# Patient Record
Sex: Female | Born: 1985 | Race: Black or African American | Hispanic: No | Marital: Single | Smoking: Never smoker
Health system: Southern US, Community
[De-identification: ages and names within clinical notes are randomized; demographics above are authoritative.]

## PROBLEM LIST (undated history)

## (undated) ENCOUNTER — Inpatient Hospital Stay (HOSPITAL_COMMUNITY): Payer: Self-pay

## (undated) DIAGNOSIS — Z8619 Personal history of other infectious and parasitic diseases: Secondary | ICD-10-CM

## (undated) DIAGNOSIS — Z789 Other specified health status: Secondary | ICD-10-CM

## (undated) HISTORY — DX: Personal history of other infectious and parasitic diseases: Z86.19

## (undated) HISTORY — PX: NO PAST SURGERIES: SHX2092

---

## 2009-09-25 ENCOUNTER — Emergency Department: Payer: Self-pay | Admitting: Internal Medicine

## 2014-02-14 ENCOUNTER — Inpatient Hospital Stay (HOSPITAL_COMMUNITY)
Admission: AD | Admit: 2014-02-14 | Discharge: 2014-02-14 | Disposition: A | Discharge status: Home / Self Care | Payer: 59 | Source: Ambulatory Visit | Attending: Obstetrics & Gynecology | Admitting: Obstetrics & Gynecology

## 2014-02-14 ENCOUNTER — Encounter (HOSPITAL_COMMUNITY): Payer: Self-pay | Admitting: *Deleted

## 2014-02-14 ENCOUNTER — Inpatient Hospital Stay (HOSPITAL_COMMUNITY): Payer: 59

## 2014-02-14 DIAGNOSIS — O26891 Other specified pregnancy related conditions, first trimester: Secondary | ICD-10-CM | POA: Insufficient documentation

## 2014-02-14 DIAGNOSIS — R109 Unspecified abdominal pain: Secondary | ICD-10-CM | POA: Diagnosis not present

## 2014-02-14 DIAGNOSIS — Z3201 Encounter for pregnancy test, result positive: Secondary | ICD-10-CM

## 2014-02-14 DIAGNOSIS — R102 Pelvic and perineal pain: Secondary | ICD-10-CM

## 2014-02-14 DIAGNOSIS — Z3A01 Less than 8 weeks gestation of pregnancy: Secondary | ICD-10-CM | POA: Insufficient documentation

## 2014-02-14 HISTORY — DX: Other specified health status: Z78.9

## 2014-02-14 LAB — URINALYSIS, ROUTINE W REFLEX MICROSCOPIC
Bilirubin Urine: NEGATIVE
Glucose, UA: NEGATIVE mg/dL
Hgb urine dipstick: NEGATIVE
Ketones, ur: NEGATIVE mg/dL
Leukocytes, UA: NEGATIVE
Nitrite: NEGATIVE
Protein, ur: NEGATIVE mg/dL
SPECIFIC GRAVITY, URINE: 1.01 (ref 1.005–1.030)
Urobilinogen, UA: 0.2 mg/dL (ref 0.0–1.0)
pH: 6.5 (ref 5.0–8.0)

## 2014-02-14 LAB — WET PREP, GENITAL
Clue Cells Wet Prep HPF POC: NONE SEEN
Trich, Wet Prep: NONE SEEN
Yeast Wet Prep HPF POC: NONE SEEN

## 2014-02-14 LAB — HCG, QUANTITATIVE, PREGNANCY: hCG, Beta Chain, Quant, S: 14999 m[IU]/mL — ABNORMAL HIGH (ref <5)

## 2014-02-14 LAB — POCT PREGNANCY, URINE: Preg Test, Ur: POSITIVE — AB

## 2014-02-14 NOTE — Discharge Instructions (Signed)

## 2014-02-14 NOTE — MAU Provider Note (Signed)
History     CSN: [id number]  Arrival date and time: 02/14/14 1246 Provider notified of pt arrival @1332  First Provider Initiated Contact with Patient 02/14/14 1336      Chief Complaint  Patient presents with  . Abdominal Pain   HPI Comments: G1 @[redacted]w[redacted]d  by LMP c/o lower abd cramping mostly on RLQ intermittently over last few weeks and worsening today. No VB. No constipation, diarrhea, nausea, or vomiting. No dysuria, polyuria, hematuria, or back pain. No new sexual partner. No hx STDs. [facility] but has [facility] scheduled.    OB History    Gravida Para Term Preterm AB TAB SAB Ectopic Multiple Living   1               Past Medical History  Diagnosis Date  . Medical history non-contributory     Past Surgical History  Procedure Laterality Date  . No past surgeries      History reviewed. No pertinent family history.  History  Substance Use Topics  . Smoking status: Never Smoker   . Smokeless tobacco: Not on file  . Alcohol Use: No    Allergies: No Known Allergies  No prescriptions prior to admission    Review of Systems  Constitutional: Negative.   HENT: Negative.   Eyes: Negative.   Respiratory: Negative.   Cardiovascular: Negative.   Skin: Negative.   Neurological: Negative.   Endo/Heme/Allergies: Negative.   Psychiatric/Behavioral: Negative.    Physical Exam   Blood pressure 125/71, pulse 68, temperature 98.2 F (36.8 C), temperature source Oral, resp. rate 16, height 5\' 5"  (1.651 m), weight 63.504 kg (140 lb), last menstrual period 12/28/2013.  Physical Exam  Constitutional: She is oriented to person, place, and time. She appears well-developed and well-nourished.  HENT:  Head: Normocephalic and atraumatic.  Eyes: Pupils are equal, round, and reactive to light.  Neck: Normal range of motion. Neck supple.  Cardiovascular: Normal rate.   Respiratory: Effort normal.  GI: Soft. Bowel sounds are normal. She exhibits no distension and no mass.  There is no tenderness. There is no rebound and no guarding.  Genitourinary: Vagina normal and uterus normal.  Speculum: scant thin white discharge, nullip os, Gc/CMT and wet prep obtained. SVE closed long, uterine s=d  Musculoskeletal: Normal range of motion.  Neurological: She is alert and oriented to person, place, and time.  Skin: Skin is warm and dry.  Psychiatric: She has a normal mood and affect.    MAU Course  Procedures  MDM UA Results for Lloyd HugerDEBOSE, Suzannah (MRN [id number]) as of 02/14/2014 14:03  Ref. Range 02/14/2014 13:14  Color, Urine Latest Range: YELLOW  YELLOW  APPearance Latest Range: CLEAR  CLEAR  Specific Gravity, Urine Latest Range: 1.005-1.030  1.010  pH Latest Range: 5.0-8.0  6.5  Glucose Latest Range: NEGATIVE mg/dL NEGATIVE  Bilirubin Urine Latest Range: NEGATIVE  NEGATIVE  Ketones, ur Latest Range: NEGATIVE mg/dL NEGATIVE  Protein Latest Range: NEGATIVE mg/dL NEGATIVE  Urobilinogen, UA Latest Range: 0.0-1.0 mg/dL 0.2  Nitrite Latest Range: NEGATIVE  NEGATIVE  Leukocytes, UA Latest Range: NEGATIVE  NEGATIVE  Hgb urine dipstick Latest Range: NEGATIVE  NEGATIVE   Wet prep-neg  GC/CMT culture-pending  Sono: TECHNIQUE: Both transabdominal and transvaginal ultrasound examinations were performed for complete evaluation of the gestation as well as the maternal uterus, adnexal regions, and pelvic cul-de-sac. Transvaginal technique was performed to assess early pregnancy.  COMPARISON: None.  FINDINGS: Intrauterine gestational sac: Visualized/normal in shape.  Yolk sac: Present  Embryo: Not visualized  MSD: 11.8 mm 5 w 6 d  US EDC: 10/11/2014  Maternal uterus/adnexae: No subchorionic hemorrhage is noted. Corpus luteum cyst is noted within the left ovary. No other focal abnormality is noted.  IMPRESSION: Probable early intrauterine gestational sac, but no fetal pole, or cardiac activity yet visualized. Recommend follow-up  quantitative B-HCG levels and follow-up US in 14 days to confirm and assess viability. This recommendation follows SRU consensus guidelines: Diagnostic Criteria for Nonviable Pregnancy Early in the First Trimester. Malva Limes Engl J Med 2013; 161:0960-45; 369:1443-51.  Quant [id number] Assessment and Plan  [id number] IUGS-no FP  Pregnancy confirmation Cramping in first trimester  Discharge home Repeat sono in 1 week in office for fetal viability Continue PNV SAB precautions  Discussed A/P with Dr. Juliene PinaMody.  Aleynah Rocchio, N 02/14/2014, 1:57 PM

## 2014-02-14 NOTE — MAU Note (Signed)
Pt starting prenatal care 11/16.  Pos HPT & UPT in October.  Lower abd cramping & pelvic pain started today.  Denies bleeding or discharge.

## 2014-02-16 LAB — GC/CHLAMYDIA PROBE AMP
CT PROBE, AMP APTIMA: NEGATIVE
GC Probe RNA: NEGATIVE

## 2014-10-05 LAB — OB RESULTS CONSOLE RUBELLA ANTIBODY, IGM: Rubella: IMMUNE

## 2014-10-05 LAB — OB RESULTS CONSOLE HIV ANTIBODY (ROUTINE TESTING): HIV: NONREACTIVE

## 2014-10-05 LAB — OB RESULTS CONSOLE HEPATITIS B SURFACE ANTIGEN: Hepatitis B Surface Ag: NEGATIVE

## 2014-10-05 LAB — OB RESULTS CONSOLE ABO/RH: RH TYPE: POSITIVE

## 2014-10-05 LAB — OB RESULTS CONSOLE GC/CHLAMYDIA
CHLAMYDIA, DNA PROBE: NEGATIVE
Gonorrhea: NEGATIVE

## 2014-10-05 LAB — OB RESULTS CONSOLE ANTIBODY SCREEN: Antibody Screen: NEGATIVE

## 2014-10-05 LAB — OB RESULTS CONSOLE RPR: RPR: NONREACTIVE

## 2014-12-20 ENCOUNTER — Encounter (HOSPITAL_COMMUNITY): Payer: Self-pay | Admitting: *Deleted

## 2015-04-15 LAB — OB RESULTS CONSOLE GBS: GBS: POSITIVE

## 2015-05-10 ENCOUNTER — Encounter (HOSPITAL_COMMUNITY): Payer: Self-pay

## 2015-05-10 ENCOUNTER — Other Ambulatory Visit: Payer: Self-pay | Admitting: Obstetrics and Gynecology

## 2015-05-10 ENCOUNTER — Encounter (HOSPITAL_COMMUNITY): Payer: Self-pay | Admitting: *Deleted

## 2015-05-10 ENCOUNTER — Inpatient Hospital Stay (HOSPITAL_COMMUNITY)
Admission: AD | Admit: 2015-05-10 | Discharge: 2015-05-14 | DRG: 765 | Disposition: A | Discharge status: Home / Self Care | Payer: 59 | Source: Ambulatory Visit | Attending: Obstetrics and Gynecology | Admitting: Obstetrics and Gynecology

## 2015-05-10 ENCOUNTER — Telehealth (HOSPITAL_COMMUNITY): Payer: Self-pay | Admitting: *Deleted

## 2015-05-10 ENCOUNTER — Inpatient Hospital Stay (HOSPITAL_COMMUNITY): Payer: 59 | Admitting: Anesthesiology

## 2015-05-10 ENCOUNTER — Other Ambulatory Visit: Payer: Self-pay | Admitting: Obstetrics & Gynecology

## 2015-05-10 DIAGNOSIS — D62 Acute posthemorrhagic anemia: Secondary | ICD-10-CM | POA: Diagnosis not present

## 2015-05-10 DIAGNOSIS — Z833 Family history of diabetes mellitus: Secondary | ICD-10-CM

## 2015-05-10 DIAGNOSIS — Z3A40 40 weeks gestation of pregnancy: Secondary | ICD-10-CM | POA: Diagnosis not present

## 2015-05-10 DIAGNOSIS — O99824 Streptococcus B carrier state complicating childbirth: Secondary | ICD-10-CM | POA: Diagnosis present

## 2015-05-10 DIAGNOSIS — O9081 Anemia of the puerperium: Secondary | ICD-10-CM | POA: Diagnosis not present

## 2015-05-10 DIAGNOSIS — IMO0001 Reserved for inherently not codable concepts without codable children: Secondary | ICD-10-CM

## 2015-05-10 DIAGNOSIS — O480 Post-term pregnancy: Secondary | ICD-10-CM | POA: Diagnosis present

## 2015-05-10 DIAGNOSIS — N838 Other noninflammatory disorders of ovary, fallopian tube and broad ligament: Secondary | ICD-10-CM | POA: Diagnosis present

## 2015-05-10 DIAGNOSIS — O3483 Maternal care for other abnormalities of pelvic organs, third trimester: Secondary | ICD-10-CM | POA: Diagnosis present

## 2015-05-10 LAB — TYPE AND SCREEN
ABO/RH(D): O POS
Antibody Screen: NEGATIVE

## 2015-05-10 LAB — CBC
HCT: 33.2 % — ABNORMAL LOW (ref 36.0–46.0)
Hemoglobin: 11.1 g/dL — ABNORMAL LOW (ref 12.0–15.0)
MCH: 28 pg (ref 26.0–34.0)
MCHC: 33.4 g/dL (ref 30.0–36.0)
MCV: 83.6 fL (ref 78.0–100.0)
PLATELETS: 285 10*3/uL (ref 150–400)
RBC: 3.97 MIL/uL (ref 3.87–5.11)
RDW: 13.5 % (ref 11.5–15.5)
WBC: 11.5 10*3/uL — ABNORMAL HIGH (ref 4.0–10.5)

## 2015-05-10 LAB — ABO/RH: ABO/RH(D): O POS

## 2015-05-10 MED ORDER — CITRIC ACID-SODIUM CITRATE 334-500 MG/5ML PO SOLN
30.0000 mL | ORAL | Status: DC | PRN
  Administered 2015-05-11: 30 mL via ORAL
  Filled 2015-05-10: qty 15

## 2015-05-10 MED ORDER — FENTANYL 2.5 MCG/ML BUPIVACAINE 1/10 % EPIDURAL INFUSION (WH - ANES)
14.0000 mL/h | INTRAMUSCULAR | Status: DC | PRN
  Administered 2015-05-10: 14 mL/h via EPIDURAL
  Filled 2015-05-10: qty 125

## 2015-05-10 MED ORDER — PENICILLIN G POTASSIUM 5000000 UNITS IJ SOLR
5.0000 10*6.[IU] | Freq: Once | INTRAVENOUS | Status: AC
  Administered 2015-05-10: 5 10*6.[IU] via INTRAVENOUS
  Filled 2015-05-10: qty 5

## 2015-05-10 MED ORDER — LIDOCAINE HCL (PF) 1 % IJ SOLN
INTRAMUSCULAR | Status: DC | PRN
  Administered 2015-05-10: 2 mL
  Administered 2015-05-10: 5 mL via EPIDURAL
  Administered 2015-05-10: 3 mL via EPIDURAL

## 2015-05-10 MED ORDER — ONDANSETRON HCL 4 MG/2ML IJ SOLN: 4.0000 mg | Freq: Four times a day (QID) | INTRAMUSCULAR | Status: DC | PRN

## 2015-05-10 MED ORDER — DIPHENHYDRAMINE HCL 50 MG/ML IJ SOLN: 12.5000 mg | INTRAMUSCULAR | Status: DC | PRN

## 2015-05-10 MED ORDER — ACETAMINOPHEN 325 MG PO TABS: 650.0000 mg | ORAL_TABLET | ORAL | Status: DC | PRN

## 2015-05-10 MED ORDER — PHENYLEPHRINE 40 MCG/ML (10ML) SYRINGE FOR IV PUSH (FOR BLOOD PRESSURE SUPPORT)
80.0000 ug | PREFILLED_SYRINGE | INTRAVENOUS | Status: DC | PRN
  Filled 2015-05-10: qty 20

## 2015-05-10 MED ORDER — EPHEDRINE 5 MG/ML INJ: 10.0000 mg | INTRAVENOUS | Status: DC | PRN

## 2015-05-10 MED ORDER — OXYCODONE-ACETAMINOPHEN 5-325 MG PO TABS: 1.0000 | ORAL_TABLET | ORAL | Status: DC | PRN

## 2015-05-10 MED ORDER — OXYCODONE-ACETAMINOPHEN 5-325 MG PO TABS: 2.0000 | ORAL_TABLET | ORAL | Status: DC | PRN

## 2015-05-10 MED ORDER — TERBUTALINE SULFATE 1 MG/ML IJ SOLN: 0.2500 mg | Freq: Once | INTRAMUSCULAR | Status: DC | PRN

## 2015-05-10 MED ORDER — OXYTOCIN BOLUS FROM INFUSION: 500.0000 mL | INTRAVENOUS | Status: DC

## 2015-05-10 MED ORDER — FLEET ENEMA 7-19 GM/118ML RE ENEM: 1.0000 | ENEMA | RECTAL | Status: DC | PRN

## 2015-05-10 MED ORDER — LACTATED RINGERS IV SOLN
INTRAVENOUS | Status: DC
  Administered 2015-05-10 – 2015-05-11 (×3): via INTRAVENOUS

## 2015-05-10 MED ORDER — OXYTOCIN 10 UNIT/ML IJ SOLN
1.0000 m[IU]/min | INTRAVENOUS | Status: DC
  Administered 2015-05-10: 2 m[IU]/min via INTRAVENOUS

## 2015-05-10 MED ORDER — PENICILLIN G POTASSIUM 5000000 UNITS IJ SOLR
2.5000 10*6.[IU] | INTRAVENOUS | Status: DC
  Administered 2015-05-10: 2.5 10*6.[IU] via INTRAVENOUS
  Filled 2015-05-10 (×5): qty 2.5

## 2015-05-10 MED ORDER — LACTATED RINGERS IV SOLN
2.5000 [IU]/h | INTRAVENOUS | Status: DC
  Filled 2015-05-10: qty 4

## 2015-05-10 MED ORDER — LACTATED RINGERS IV SOLN: 500.0000 mL | INTRAVENOUS | Status: DC | PRN

## 2015-05-10 MED ORDER — LIDOCAINE HCL (PF) 1 % IJ SOLN: 30.0000 mL | INTRAMUSCULAR | Status: DC | PRN

## 2015-05-10 NOTE — MAU Note (Signed)
Notified provider that patient is here for SROM fern test positive. Contracting every 2-3 mins. Baby nonreactive HR baseline 125. 3cm on cervical exam. Provider said to admit patient.

## 2015-05-10 NOTE — Anesthesia Preprocedure Evaluation (Signed)
Anesthesia Evaluation  Patient identified by MRN, date of birth, ID band Patient awake    Reviewed: Allergy & Precautions, NPO status , Patient's Chart, lab work & pertinent test results  Airway Mallampati: I  TM Distance: >3 FB Neck ROM: Full    Dental  (+) Teeth Intact, Dental Advisory Given   Pulmonary neg pulmonary ROS,    Pulmonary exam normal breath sounds clear to auscultation       Cardiovascular Exercise Tolerance: Good negative cardio ROS Normal cardiovascular exam Rhythm:Regular Rate:Normal     Neuro/Psych negative neurological ROS  negative psych ROS   GI/Hepatic negative GI ROS, Neg liver ROS,   Endo/Other  negative endocrine ROS  Renal/GU negative Renal ROS     Musculoskeletal negative musculoskeletal ROS (+)   Abdominal   Peds  Hematology  (+) Blood dyscrasia, anemia , Plt 285k   Anesthesia Other Findings Day of surgery medications reviewed with the patient.  Reproductive/Obstetrics (+) Pregnancy                             Anesthesia Physical Anesthesia Plan  ASA: II  Anesthesia Plan: Epidural   Post-op Pain Management:    Induction:   Airway Management Planned:   Additional Equipment:   Intra-op Plan:   Post-operative Plan:   Informed Consent: I have reviewed the patients History and Physical, chart, labs and discussed the procedure including the risks, benefits and alternatives for the proposed anesthesia with the patient or authorized representative who has indicated his/her understanding and acceptance.   Dental advisory given  Plan Discussed with:   Anesthesia Plan Comments: (Patient identified. Risks/Benefits/Options discussed with patient including but not limited to bleeding, infection, nerve damage, paralysis, failed block, incomplete pain control, headache, blood pressure changes, nausea, vomiting, reactions to medication both or allergic, itching  and postpartum back pain. Confirmed with bedside nurse the patient's most recent platelet count. Confirmed with patient that they are not currently taking any anticoagulation, have any bleeding history or any family history of bleeding disorders. Patient expressed understanding and wished to proceed. All questions were answered. )        Anesthesia Quick Evaluation

## 2015-05-10 NOTE — H&P (Signed)
Vanessa Ritter is a 30 y.o. female presenting @ 72 1/[redacted] weeks gestation for admission due to SROm @ 5:30 pm clear fluid. (+) ctx. GBS cx (+). Denies h/a, visual changes or BP issues . Maternal Medical History:  Reason for admission: Rupture of membranes and contractions.   Contractions: Onset was 3-5 hours ago.    Fetal activity: Perceived fetal activity is normal.    Prenatal complications: no prenatal complications Prenatal Complications - Diabetes: none.    OB History    Gravida Para Term Preterm AB TAB SAB Ectopic Multiple Living   Past Medical History  Diagnosis Date  . Medical history non-contributory   . Hx of varicella    Past Surgical History  Procedure Laterality Date  . No past surgeries     Family History: family history includes Diabetes in her maternal grandfather. Social History:  reports that she has never smoked. She does not have any smokeless tobacco history on file. She reports that she does not drink alcohol or use illicit drugs.   Prenatal Transfer Tool  Maternal Diabetes: No Genetic Screening: Declined Maternal Ultrasounds/Referrals: Normal Fetal Ultrasounds or other Referrals:  None Maternal Substance Abuse:  No Significant Maternal Medications:  None Significant Maternal Lab Results:  Lab values include: Group B Strep positive Other Comments:  None  Review of Systems  All other systems reviewed and are negative.   Dilation: 3 Effacement (%): 90 Station: -2 Exam by:: V Rogers RN  Blood pressure 142/93, pulse 76, temperature 98.8 F (37.1 C), temperature source Oral, resp. rate 17, height  (1.651 m), weight 79.379 kg (175 lb), last menstrual period 07/26/2014, unknown if currently breastfeeding. Exam Physical Exam  Constitutional: She appears well-developed and well-nourished.  Eyes: EOM are normal.  Neck: Neck supple.  Cardiovascular: Normal rate.   Respiratory: Effort normal.  GI: Soft.  Musculoskeletal: She  exhibits edema.  Neurological: She displays abnormal reflex.  Skin: Skin is warm and dry.  Psychiatric: She has a normal mood and affect.   VE 3/90/+1  Prenatal labs: ABO, Rh: --/--/O POS (01/24 1854) Antibody: NEG (01/24 1854) Rubella: Immune (06/21 0000) RPR: Nonreactive (06/21 0000)  HBsAg: Negative (06/21 0000)  HIV: Non-reactive (06/21 0000)  GBS: Positive (12/30 0000)   Assessment/Plan: SROM Latent phase GBS cx (+) on PCN Postterm P) admit routine labs. Pitocin augmentation. IV PCN. Analgesic/epidural  prn  Vanessa Ritter A 05/10/2015, 8:33 PM

## 2015-05-10 NOTE — Anesthesia Procedure Notes (Signed)
Epidural Patient location during procedure: OB  Staffing Anesthesiologist: Bernal Luhman EDWARD Performed by: anesthesiologist   Preanesthetic Checklist Completed: patient identified, pre-op evaluation, timeout performed, IV checked, risks and benefits discussed and monitors and equipment checked  Epidural Patient position: sitting Prep: DuraPrep Patient monitoring: blood pressure and continuous pulse ox Approach: midline Location: L3-L4 Injection technique: LOR air  Needle:  Needle type: Tuohy  Needle gauge: 17 G Needle length: 9 cm Needle insertion depth: 5 cm Catheter size: 19 Gauge Catheter at skin depth: 10 cm Test dose: negative and Other (1% Lidocaine)  Additional Notes Patient identified.  Risk benefits discussed including failed block, incomplete pain control, headache, nerve damage, paralysis, blood pressure changes, nausea, vomiting, reactions to medication both toxic or allergic, and postpartum back pain.  Patient expressed understanding and wished to proceed.  All questions were answered.  Sterile technique used throughout procedure and epidural site dressed with sterile barrier dressing. No paresthesia or other complications noted. The patient did not experience any signs of intravascular injection such as tinnitus or metallic taste in mouth nor signs of intrathecal spread such as rapid motor block. Please see nursing notes for vital signs. Reason for block:procedure for pain   

## 2015-05-10 NOTE — MAU Note (Signed)
Patient presents with PROM at 1700 today and contractions. Patient denies vaginal bleeding. Fetus active.

## 2015-05-10 NOTE — Telephone Encounter (Signed)
Preadmission screen  

## 2015-05-11 ENCOUNTER — Encounter (HOSPITAL_COMMUNITY): Payer: Self-pay | Admitting: Certified Nurse Midwife

## 2015-05-11 ENCOUNTER — Encounter (HOSPITAL_COMMUNITY)
Admission: AD | Disposition: A | Discharge status: Home / Self Care | Payer: Self-pay | Source: Ambulatory Visit | Attending: Obstetrics and Gynecology

## 2015-05-11 LAB — SYPHILIS: RPR W/REFLEX TO RPR TITER AND TREPONEMAL ANTIBODIES, TRADITIONAL SCREENING AND DIAGNOSIS ALGORITHM: RPR Ser Ql: NONREACTIVE

## 2015-05-11 SURGERY — Surgical Case
Anesthesia: Regional

## 2015-05-11 MED ORDER — SODIUM BICARBONATE 8.4 % IV SOLN
INTRAVENOUS | Status: DC | PRN
  Administered 2015-05-11: 5 mL via EPIDURAL

## 2015-05-11 MED ORDER — ONDANSETRON 4 MG PO TBDP
4.0000 mg | ORAL_TABLET | Freq: Three times a day (TID) | ORAL | Status: DC | PRN
  Filled 2015-05-11: qty 1

## 2015-05-11 MED ORDER — ONDANSETRON HCL 4 MG/2ML IJ SOLN
4.0000 mg | Freq: Four times a day (QID) | INTRAMUSCULAR | Status: DC | PRN
  Administered 2015-05-11: 4 mg via INTRAVENOUS
  Filled 2015-05-11: qty 2

## 2015-05-11 MED ORDER — MORPHINE SULFATE (PF) 0.5 MG/ML IJ SOLN
INTRAMUSCULAR | Status: DC | PRN
  Administered 2015-05-11: 1 mg via INTRAVENOUS
  Administered 2015-05-11: 4 mg via EPIDURAL

## 2015-05-11 MED ORDER — SODIUM CHLORIDE 0.9 % IV SOLN: 250.0000 mL | INTRAVENOUS | Status: DC

## 2015-05-11 MED ORDER — KETOROLAC TROMETHAMINE 30 MG/ML IJ SOLN: 30.0000 mg | Freq: Four times a day (QID) | INTRAMUSCULAR | Status: DC | PRN

## 2015-05-11 MED ORDER — NALOXONE HCL 0.4 MG/ML IJ SOLN: 0.4000 mg | INTRAMUSCULAR | Status: DC | PRN

## 2015-05-11 MED ORDER — PRENATAL MULTIVITAMIN CH
1.0000 | ORAL_TABLET | Freq: Every day | ORAL | Status: DC
  Administered 2015-05-11 – 2015-05-13 (×3): 1 via ORAL
  Filled 2015-05-11 (×3): qty 1

## 2015-05-11 MED ORDER — DIPHENHYDRAMINE HCL 50 MG/ML IJ SOLN: 12.5000 mg | INTRAMUSCULAR | Status: DC | PRN

## 2015-05-11 MED ORDER — IBUPROFEN 600 MG PO TABS
600.0000 mg | ORAL_TABLET | Freq: Four times a day (QID) | ORAL | Status: DC
  Administered 2015-05-11 – 2015-05-14 (×12): 600 mg via ORAL
  Filled 2015-05-11 (×12): qty 1

## 2015-05-11 MED ORDER — BISACODYL 10 MG RE SUPP: 10.0000 mg | Freq: Every day | RECTAL | Status: DC | PRN

## 2015-05-11 MED ORDER — DIPHENHYDRAMINE HCL 25 MG PO CAPS: 25.0000 mg | ORAL_CAPSULE | Freq: Four times a day (QID) | ORAL | Status: DC | PRN

## 2015-05-11 MED ORDER — LACTATED RINGERS IV SOLN
INTRAVENOUS | Status: DC
  Administered 2015-05-11 (×2): via INTRAVENOUS

## 2015-05-11 MED ORDER — SIMETHICONE 80 MG PO CHEW
80.0000 mg | CHEWABLE_TABLET | Freq: Three times a day (TID) | ORAL | Status: DC
  Administered 2015-05-11 – 2015-05-14 (×8): 80 mg via ORAL
  Filled 2015-05-11 (×8): qty 1

## 2015-05-11 MED ORDER — SODIUM CHLORIDE 0.9% FLUSH: 3.0000 mL | Freq: Two times a day (BID) | INTRAVENOUS | Status: DC

## 2015-05-11 MED ORDER — WITCH HAZEL-GLYCERIN EX PADS: 1.0000 "application " | MEDICATED_PAD | CUTANEOUS | Status: DC | PRN

## 2015-05-11 MED ORDER — DIPHENHYDRAMINE HCL 25 MG PO CAPS
25.0000 mg | ORAL_CAPSULE | ORAL | Status: DC | PRN
Start: 2015-05-11 — End: 2015-05-11

## 2015-05-11 MED ORDER — LANOLIN HYDROUS EX OINT: 1.0000 "application " | TOPICAL_OINTMENT | CUTANEOUS | Status: DC | PRN

## 2015-05-11 MED ORDER — FERROUS SULFATE 325 (65 FE) MG PO TABS
325.0000 mg | ORAL_TABLET | Freq: Two times a day (BID) | ORAL | Status: DC
  Administered 2015-05-11 – 2015-05-14 (×6): 325 mg via ORAL
  Filled 2015-05-11 (×6): qty 1

## 2015-05-11 MED ORDER — MORPHINE SULFATE (PF) 0.5 MG/ML IJ SOLN
INTRAMUSCULAR | Status: AC
  Filled 2015-05-11: qty 10

## 2015-05-11 MED ORDER — NALBUPHINE HCL 10 MG/ML IJ SOLN: 5.0000 mg | INTRAMUSCULAR | Status: DC | PRN

## 2015-05-11 MED ORDER — KETOROLAC TROMETHAMINE 30 MG/ML IJ SOLN
30.0000 mg | Freq: Four times a day (QID) | INTRAMUSCULAR | Status: DC | PRN
  Administered 2015-05-11: 30 mg via INTRAMUSCULAR

## 2015-05-11 MED ORDER — ACETAMINOPHEN 325 MG PO TABS: 650.0000 mg | ORAL_TABLET | ORAL | Status: DC | PRN

## 2015-05-11 MED ORDER — NALBUPHINE HCL 10 MG/ML IJ SOLN
5.0000 mg | INTRAMUSCULAR | Status: DC | PRN
Start: 2015-05-11 — End: 2015-05-11

## 2015-05-11 MED ORDER — DIBUCAINE 1 % RE OINT: 1.0000 "application " | TOPICAL_OINTMENT | RECTAL | Status: DC | PRN

## 2015-05-11 MED ORDER — ZOLPIDEM TARTRATE 5 MG PO TABS: 5.0000 mg | ORAL_TABLET | Freq: Every evening | ORAL | Status: DC | PRN

## 2015-05-11 MED ORDER — ACETAMINOPHEN 500 MG PO TABS: 1000.0000 mg | ORAL_TABLET | Freq: Four times a day (QID) | ORAL | Status: DC

## 2015-05-11 MED ORDER — ONDANSETRON HCL 4 MG/2ML IJ SOLN
INTRAMUSCULAR | Status: DC | PRN
  Administered 2015-05-11: 4 mg via INTRAVENOUS

## 2015-05-11 MED ORDER — MEPERIDINE HCL 25 MG/ML IJ SOLN
INTRAMUSCULAR | Status: DC | PRN
  Administered 2015-05-11 (×2): 12.5 mg via INTRAVENOUS

## 2015-05-11 MED ORDER — MEPERIDINE HCL 25 MG/ML IJ SOLN
INTRAMUSCULAR | Status: AC
  Filled 2015-05-11: qty 1

## 2015-05-11 MED ORDER — NALBUPHINE HCL 10 MG/ML IJ SOLN: 5.0000 mg | Freq: Once | INTRAMUSCULAR | Status: DC | PRN

## 2015-05-11 MED ORDER — ONDANSETRON HCL 4 MG/2ML IJ SOLN
4.0000 mg | Freq: Three times a day (TID) | INTRAMUSCULAR | Status: DC | PRN
Start: 2015-05-11 — End: 2015-05-11

## 2015-05-11 MED ORDER — FLEET ENEMA 7-19 GM/118ML RE ENEM: 1.0000 | ENEMA | Freq: Every day | RECTAL | Status: DC | PRN

## 2015-05-11 MED ORDER — SCOPOLAMINE 1 MG/3DAYS TD PT72
MEDICATED_PATCH | TRANSDERMAL | Status: DC | PRN
  Administered 2015-05-11: 1 via TRANSDERMAL

## 2015-05-11 MED ORDER — OXYTOCIN 10 UNIT/ML IJ SOLN
40.0000 [IU] | INTRAVENOUS | Status: DC | PRN
  Administered 2015-05-11: 40 [IU] via INTRAVENOUS

## 2015-05-11 MED ORDER — SIMETHICONE 80 MG PO CHEW: 80.0000 mg | CHEWABLE_TABLET | ORAL | Status: DC | PRN

## 2015-05-11 MED ORDER — BUPIVACAINE HCL (PF) 0.25 % IJ SOLN
INTRAMUSCULAR | Status: AC
  Filled 2015-05-11: qty 30

## 2015-05-11 MED ORDER — KETOROLAC TROMETHAMINE 30 MG/ML IJ SOLN
INTRAMUSCULAR | Status: AC
  Administered 2015-05-11: 30 mg via INTRAMUSCULAR
  Filled 2015-05-11: qty 1

## 2015-05-11 MED ORDER — BUPIVACAINE HCL (PF) 0.25 % IJ SOLN
INTRAMUSCULAR | Status: DC | PRN
  Administered 2015-05-11: 7 mL

## 2015-05-11 MED ORDER — SENNOSIDES-DOCUSATE SODIUM 8.6-50 MG PO TABS
2.0000 | ORAL_TABLET | ORAL | Status: DC
  Administered 2015-05-12 – 2015-05-13 (×3): 2 via ORAL
  Filled 2015-05-11 (×3): qty 2

## 2015-05-11 MED ORDER — OXYTOCIN 10 UNIT/ML IJ SOLN: 2.5000 [IU]/h | INTRAVENOUS | Status: AC

## 2015-05-11 MED ORDER — METHYLERGONOVINE MALEATE 0.2 MG PO TABS: 0.2000 mg | ORAL_TABLET | ORAL | Status: DC | PRN

## 2015-05-11 MED ORDER — MEPERIDINE HCL 25 MG/ML IJ SOLN: 6.2500 mg | INTRAMUSCULAR | Status: DC | PRN

## 2015-05-11 MED ORDER — PROMETHAZINE HCL 25 MG/ML IJ SOLN: 6.2500 mg | INTRAMUSCULAR | Status: DC | PRN

## 2015-05-11 MED ORDER — NALBUPHINE HCL 10 MG/ML IJ SOLN
5.0000 mg | Freq: Once | INTRAMUSCULAR | Status: DC | PRN
Start: 2015-05-11 — End: 2015-05-11

## 2015-05-11 MED ORDER — OXYCODONE-ACETAMINOPHEN 5-325 MG PO TABS
1.0000 | ORAL_TABLET | Freq: Four times a day (QID) | ORAL | Status: DC | PRN
  Administered 2015-05-13: 1 via ORAL
  Filled 2015-05-11: qty 1

## 2015-05-11 MED ORDER — SIMETHICONE 80 MG PO CHEW
80.0000 mg | CHEWABLE_TABLET | ORAL | Status: DC
  Administered 2015-05-12 – 2015-05-13 (×3): 80 mg via ORAL
  Filled 2015-05-11 (×3): qty 1

## 2015-05-11 MED ORDER — SODIUM CHLORIDE 0.9% FLUSH: 3.0000 mL | INTRAVENOUS | Status: DC | PRN

## 2015-05-11 MED ORDER — NALOXONE HCL 2 MG/2ML IJ SOSY
1.0000 ug/kg/h | PREFILLED_SYRINGE | INTRAVENOUS | Status: DC | PRN
  Filled 2015-05-11: qty 2

## 2015-05-11 MED ORDER — MENTHOL 3 MG MT LOZG: 1.0000 | LOZENGE | OROMUCOSAL | Status: DC | PRN

## 2015-05-11 MED ORDER — DEXAMETHASONE SODIUM PHOSPHATE 4 MG/ML IJ SOLN
INTRAMUSCULAR | Status: DC | PRN
  Administered 2015-05-11: 4 mg via INTRAVENOUS

## 2015-05-11 MED ORDER — PHENYLEPHRINE HCL 10 MG/ML IJ SOLN
INTRAMUSCULAR | Status: DC | PRN
  Administered 2015-05-11: 40 ug via INTRAVENOUS
  Administered 2015-05-11: 80 ug via INTRAVENOUS

## 2015-05-11 MED ORDER — METHYLERGONOVINE MALEATE 0.2 MG/ML IJ SOLN: 0.2000 mg | INTRAMUSCULAR | Status: DC | PRN

## 2015-05-11 MED ORDER — CEFAZOLIN SODIUM-DEXTROSE 2-3 GM-% IV SOLR
INTRAVENOUS | Status: DC | PRN
  Administered 2015-05-11: 2 g via INTRAVENOUS

## 2015-05-11 SURGICAL SUPPLY — 45 items
BARRIER ADHS 3X4 INTERCEED (GAUZE/BANDAGES/DRESSINGS) ×3 IMPLANT
BENZOIN TINCTURE PRP APPL 2/3 (GAUZE/BANDAGES/DRESSINGS) ×3 IMPLANT
CLAMP CORD UMBIL (MISCELLANEOUS) IMPLANT
CLOSURE STERI STRIP 1/2 X4 (GAUZE/BANDAGES/DRESSINGS) ×3 IMPLANT
CLOSURE WOUND 1/2 X4 (GAUZE/BANDAGES/DRESSINGS)
CLOTH BEACON ORANGE TIMEOUT ST (SAFETY) ×3 IMPLANT
CONTAINER PREFILL 10% NBF 15ML (MISCELLANEOUS) IMPLANT
DRAPE C SECTION CLR SCREEN (DRAPES) ×3 IMPLANT
DRAPE SHEET LG 3/4 BI-LAMINATE (DRAPES) IMPLANT
DRSG OPSITE POSTOP 4X10 (GAUZE/BANDAGES/DRESSINGS) ×3 IMPLANT
DURAPREP 26ML APPLICATOR (WOUND CARE) ×3 IMPLANT
ELECT REM PT RETURN 9FT ADLT (ELECTROSURGICAL) ×3
ELECTRODE REM PT RTRN 9FT ADLT (ELECTROSURGICAL) ×1 IMPLANT
EXTRACTOR VACUUM M CUP 4 TUBE (SUCTIONS) IMPLANT
EXTRACTOR VACUUM M CUP 4' TUBE (SUCTIONS)
GLOVE BIOGEL PI IND STRL 7.0 (GLOVE) ×2 IMPLANT
GLOVE BIOGEL PI INDICATOR 7.0 (GLOVE) ×4
GLOVE ECLIPSE 6.5 STRL STRAW (GLOVE) ×3 IMPLANT
GOWN STRL REUS W/TWL LRG LVL3 (GOWN DISPOSABLE) ×6 IMPLANT
KIT ABG SYR 3ML LUER SLIP (SYRINGE) IMPLANT
NEEDLE HYPO 22GX1.5 SAFETY (NEEDLE) ×3 IMPLANT
NEEDLE HYPO 25X5/8 SAFETYGLIDE (NEEDLE) IMPLANT
NS IRRIG 1000ML POUR BTL (IV SOLUTION) ×3 IMPLANT
PACK C SECTION WH (CUSTOM PROCEDURE TRAY) ×3 IMPLANT
PAD OB MATERNITY 4.3X12.25 (PERSONAL CARE ITEMS) ×3 IMPLANT
RTRCTR C-SECT PINK 25CM LRG (MISCELLANEOUS) IMPLANT
STRIP CLOSURE SKIN 1/2X4 (GAUZE/BANDAGES/DRESSINGS) IMPLANT
SUT CHROMIC GUT AB #0 18 (SUTURE) IMPLANT
SUT MNCRL 0 VIOLET CTX 36 (SUTURE) ×3 IMPLANT
SUT MON AB 2-0 SH 27 (SUTURE)
SUT MON AB 2-0 SH27 (SUTURE) IMPLANT
SUT MON AB 3-0 SH 27 (SUTURE)
SUT MON AB 3-0 SH27 (SUTURE) IMPLANT
SUT MON AB 4-0 PS1 27 (SUTURE) IMPLANT
SUT MONOCRYL 0 CTX 36 (SUTURE) ×6
SUT PLAIN 2 0 (SUTURE) ×2
SUT PLAIN 2 0 XLH (SUTURE) IMPLANT
SUT PLAIN ABS 2-0 CT1 27XMFL (SUTURE) ×1 IMPLANT
SUT VIC AB 0 CT1 36 (SUTURE) ×6 IMPLANT
SUT VIC AB 2-0 CT1 27 (SUTURE) ×2
SUT VIC AB 2-0 CT1 TAPERPNT 27 (SUTURE) ×1 IMPLANT
SUT VIC AB 4-0 PS2 27 (SUTURE) ×3 IMPLANT
SYR CONTROL 10ML LL (SYRINGE) ×3 IMPLANT
TOWEL OR 17X24 6PK STRL BLUE (TOWEL DISPOSABLE) ×3 IMPLANT
TRAY FOLEY CATH SILVER 14FR (SET/KITS/TRAYS/PACK) IMPLANT

## 2015-05-11 NOTE — Progress Notes (Signed)
S: notes some pelvic pressure  O: Blood pressure 111/65, pulse 71, temperature 98.4 F (36.9 C), temperature source Oral, resp. rate 17, height  (1.651 m), weight 79.379 kg (175 lb), last menstrual period 07/26/2014, unknown if currently breastfeeding.   Pitocin 14 miu Epidural VE 8/some edema/-1/0 IUPC replaced ISE placed Tracing: baseline 120 min variability  Some early decel Ctx q 1 1/2 mins  IMP: Active phase arrest GBS cx (+) on IV PCN P) decreased  Pitocin. Maternal O2. Pt advised if current tracing remains, will need to proceed with C/S Left exaggerated sims

## 2015-05-11 NOTE — Progress Notes (Signed)
Patient ID: Vanessa Ritter, female   DOB: 1986/02/04, 30 y.o.   MRN: [id number] Subjective: POD# 0 Information for the patient's newborn:  Julien, [place] Girl Deriana [[id number]]  female   Reports feeling very tired and nauseated, but "okay". Feeding: breast Patient reports tolerating PO.  Breast symptoms: none Pain controlled with ibuprofen (OTC) and postoperative narcotic medications Denies HA/SOB/C/P/N/V/dizziness. Flatus absent. No BM. She reports vaginal bleeding as normal, without clots.  She is ambulating, Foley indwelling, draining copious amounts of clear urine to gravity.  Objective:   VS:  Filed Vitals:   05/11/15 0705 05/11/15 0711 05/11/15 0719 05/11/15 0828  BP:  135/77 128/65 117/78  Pulse: 70  68 64  Temp:  98 F (36.7 C) 97.9 F (36.6 C) 97.7 F (36.5 C)  TempSrc:  Oral Axillary Axillary  Resp: Height:      Weight:      SpO2: 98%  99% 98%     Intake/Output Summary (Last 24 hours) at 05/11/15 0848 Last data filed at 05/11/15 6295  Gross per 24 hour  Intake   1680 ml  Output   4050 ml  Net  -2370 ml        Recent Labs  05/10/15 1854  WBC 11.5*  HGB 11.1*  HCT 33.2*  PLT 285     Blood type: --/--/O POS, O POS (01/24 1854)  Rubella: Immune (06/21 0000)     Physical Exam:   General: alert, cooperative, fatigued and no distress  CV: Regular rate and rhythm, S1S2 present or without murmur or extra heart sounds  Resp: clear  Abdomen: soft, nontender, normal bowel sounds  Incision: clean, dry, intact and skin well-approximated with sutures  Uterine Fundus: firm, 1 FB below umbilicus, nontender  Lochia: minimal  Ext: extremities normal, atraumatic, no cyanosis or edema, Homans sign is negative, no sign of DVT and no edema, redness or tenderness in the calves or thighs   Assessment/Plan: 30 y.o.   POD# 0.  S/P Cesarean Delivery.  Indications: non-reassuring FHR tracing and arrest of dilation                Principal Problem:   Postpartum care  following cesarean delivery (1/24) Active Problems:   Active labor at term  Doing well, stable.               Regular diet as tolerated Zofran as ordered D/C foley and IV per unit protocol Ambulate Routine post-op care  Kenard Gower, MSN, CNM 05/11/2015, 8:48 AM

## 2015-05-11 NOTE — Consult Note (Signed)
[facility]  Delivery Note:  C-section       05/11/2015  5:07 AM  I was called to the operating room at the request of the patient's obstetrician (Dr. Cousins) for a primary c-section.  PRENATAL HX:  This is a 29 y/o G2P0010 at 40 and 2/[redacted] weeks gestation admited last night form SROM at 5:30 PM (ROM ~12 hours).  Her pregnancy was not complicated.  Delivery was by c-section for fetal intolerance of labor  DELIVERY:  Infant was vigorous at delivery, requiring no resuscitation other than standard warming, drying and stimulation.  APGARs 8 and 9.  Exam within normal limits.  After 5 minutes, baby left with nurse to assist parents with skin-to-skin care.   _____________________ Electronically Signed By: Ramond Darnell, MD Neonatologist  

## 2015-05-11 NOTE — Anesthesia Postprocedure Evaluation (Signed)
Anesthesia Post Note  Patient: Vanessa Ritter  Procedure(s) Performed: Procedure(s) (LRB): CESAREAN SECTION (N/A)  Patient location during evaluation: Mother Baby Anesthesia Type: Epidural Level of consciousness: awake and alert and oriented Pain management: pain level controlled Vital Signs Assessment: post-procedure vital signs reviewed and stable Respiratory status: spontaneous breathing and respiratory function stable Cardiovascular status: stable Postop Assessment: no headache, no backache, patient able to bend at knees, no signs of nausea or vomiting and adequate PO intake Anesthetic complications: no    Last Vitals:  Filed Vitals:   05/11/15 0828 05/11/15 1215  BP: 117/78 120/67  Pulse: 64 62  Temp: 36.5 C 37 C  Resp: 18 18    Last Pain:  Filed Vitals:   05/11/15 1218  PainSc: 3                  Ivania Teagarden

## 2015-05-11 NOTE — Transfer of Care (Signed)
Immediate Anesthesia Transfer of Care Note  Patient: Vanessa Ritter  Procedure(s) Performed: Procedure(s): CESAREAN SECTION (N/A)  Patient Location: PACU  Anesthesia Type:Epidural  Level of Consciousness: awake, alert  and oriented  Airway & Oxygen Therapy: Patient Spontanous Breathing  Post-op Assessment: Report given to RN and Post -op Vital signs reviewed and stable  Post vital signs: Reviewed and stable  Last Vitals:  Filed Vitals:   05/11/15 0433 05/11/15 0439  BP: 238/185 125/67  Pulse: 99 103  Temp:    Resp:      Complications: No apparent anesthesia complications

## 2015-05-11 NOTE — Brief Op Note (Signed)
05/10/2015 - 05/11/2015  5:46 AM  PATIENT:  Vanessa Ritter  30 y.o. female  PRE-OPERATIVE DIAGNOSIS:  Non- reassuring fetal heart rate, arrest of dilation, term gestation, GBS cx (+)  POST-OPERATIVE DIAGNOSIS:  Non- reassuring fetal heart rate, arrest of dilation, term gestation, paratubal cyst, GBS cx (+)  PROCEDURE:  Primary cesarean section, kerr hysterotomy Paratubal cyst removal(right)  SURGEON:  Surgeon(s) and Role:    * Maxie Better, MD - Primary  PHYSICIAN ASSISTANT:   ASSISTANTS: Marlinda Mike, CNM   ANESTHESIA:   epidural Finding: live female ROP, nl ovaries, large right paratubal cyst, nl tubes  EBL:  Total I/O In: -  Out: 1000 [Urine:400; Blood:600]  BLOOD ADMINISTERED:none  DRAINS: none   LOCAL MEDICATIONS USED:  MARCAINE     SPECIMEN:  Source of Specimen:  placenta, right paratubal cyst  DISPOSITION OF SPECIMEN:  PATHOLOGY  COUNTS:  YES  TOURNIQUET:  * No tourniquets in log *  DICTATION: .Other Dictation: Dictation Number [id number]  PLAN OF CARE: Admit to inpatient   PATIENT DISPOSITION:  PACU - hemodynamically stable.   Delay start of Pharmacological VTE agent (>24hrs) due to surgical blood loss or risk of bleeding: no

## 2015-05-11 NOTE — Op Note (Signed)
NAMEDORAINE, SCHEXNIDER              ACCOUNT NO.:  [id number]  MEDICAL RECORD NO.:  [id number]  LOCATION:  [facility]                          FACILITY:  [facility]  PHYSICIAN:  Maxie Better, M.D.DATE OF BIRTH:  04-05-1986  DATE OF PROCEDURE:  05/11/2015 DATE OF DISCHARGE:                              OPERATIVE REPORT   PREOPERATIVE DIAGNOSIS:  Nonreassuring fetal tracing, arrest of dilatation, term gestation, group B strep culture positive.  PROCEDURES:  Primary cesarean section, Kerr hysterotomy, right paratubal cyst removal  POSTOPERATIVE DIAGNOSIS:  Non-reassuring fetal heart tracing, arrest of dilatation, term gestation, Paratubal cyst, group B strep culture positive.  ANESTHESIA:  Epidural.  ASSISTANT:  Marlinda Mike, CNM.  DESCRIPTION OF PROCEDURE:  Under adequate epidural anesthesia, the patient was placed in supine position with a left lateral tilt.  She was sterilely prepped and draped in usual fashion.  An indwelling Foley catheter was already in place.  0.25% Marcaine was injected along the planned Pfannenstiel skin incision site.  Pfannenstiel skin incision was then made, carried down to the rectus fascia.  Rectus fascia was opened transversely.  Rectus fascia then bluntly and sharply dissected off the rectus muscle in superior and inferior fashion.  The rectus muscle was split in midline.  The parietal peritoneum was entered sharply and extended.  The Alexis self-retaining retractor was placed. Vesicouterine peritoneum was opened transversely.  The bladder was bluntly dissected off the lower uterine segment and displaced inferiorly.  A curvilinear low transverse uterine incision was then made and extended with bandage scissors.  Subsequent delivery of a live female from the right occiput posterior presentation was accomplished. Loop of cord exited from the uterus as it was opened.  Baby was bulb suctioned on the abdomen.  The cord was clamped, cut.  The baby was transferred  to the awaiting pediatrician, who assigned Apgars of 8 and 9 at 1 and 5 minutes.  The placenta was spontaneous, intact, sent to Pathology. Uterine cavity was cleaned of debris.  Uterine incision had no extension.  The incision was closed in 2 layers, the first layer with 0 Monocryl running locked stitch, second layer was imbricated using 0 Monocryl suture.  Small bleeding along the peritoneal edges was Cauterized. Attention was then turned to the adnexa, the tubes and ovaries were normal.  Right tube had  a large elongated paratubal cyst, which was then removed with cautery.  The abdomen was irrigated and suctioned of debris.  The Alexis retractor was removed.  Interceed was placed over lower uterine segment in an inverted T fashion.  The parietal peritoneum was then closed with 2-0 Vicryl. The rectus fascia was closed with 0 Vicryl x2.  The subcutaneous area was irrigated, small bleeders cauterized.  Interrupted 2-0 plain sutures placed and the skin approximated with 4-0 Vicryl subcuticular sutures.  SPECIMEN:  Placenta and paratubal cyst sent to Pathology.  Cord pH was obtained.  ESTIMATED BLOOD LOSS:  600 ml.  URINE OUTPUT:  400 mL.  INTRAOPERATIVE FLUID:  1200 mL.  COUNTS:  Sponge, needles, and instrument counts x2 was correct.  COMPLICATION:  None.  The patient tolerated the procedure well, was transferred to the recovery room in stable condition. Baby was placed  skin to skin.     Maxie Better, M.D.     [place]/MEDQ  D:  05/11/2015  T:  05/11/2015  Job:  [id number]

## 2015-05-11 NOTE — Lactation Note (Signed)
This note was copied from the chart of Vanessa Ritter. Lactation Consultation Note  Patient Name: Vanessa Ritter [id number] Date: 05/11/2015 Reason for consult: Follow-up assessment Baby at 16 hr of life and RN reports that baby is not eating. Tried to get baby latched to the R breast without the NS. Mom has short shaft nipples and areolar edema. Mom had a #20 NS in the room. Applied NS and baby latched easily. Baby sucked 5 minutes, pulled off, then went right back on for another 5 minutes. Baby was still going when [facility] left room. Mom is to offer the breast on demand 8+/24hr for at least 10 minutes each side and post pump for 5- 10 minutes. Mom is to wear inverted nipple shells, that are in the room between feedings.   Maternal Data    Feeding Feeding Type: Breast Fed Length of feed: 10 min  LATCH Score/Interventions Latch: Repeated attempts needed to sustain latch, nipple held in mouth throughout feeding, stimulation needed to elicit sucking reflex. Intervention(s): Skin to skin;Teach feeding cues Intervention(s): Adjust position;Breast compression;Assist with latch  Audible Swallowing: Spontaneous and intermittent Intervention(s): Hand expression;Skin to skin Intervention(s): Alternate breast massage  Type of Nipple: Everted at rest and after stimulation  Comfort (Breast/Nipple): Soft / non-tender     Hold (Positioning): Assistance needed to correctly position infant at breast and maintain latch. Intervention(s): Position options;Support Pillows  LATCH Score: 8  Lactation Tools Discussed/Used Tools: Nipple Shields Nipple shield size: 20   Consult Status Consult Status: Follow-up Date: 05/12/15 Follow-up type: In-patient    Rulon Eisenmenger 05/11/2015, 9:07 PM

## 2015-05-11 NOTE — Lactation Note (Signed)
This note was copied from the chart of Vanessa Ritter. Lactation Consultation Note  Initial visit made.  Breastfeeding consultation services and support given and reviewed with patient.  Baby is 10 hours old and no latch yet.  Baby has been very sleepy.  Mom holding baby skin to skin.  She took breastfeeding class and very motivated to breastfeed.  Both nipples invert with breast compression.  Mom states they are usually erect so possibly some tissue swelling is present.  Breast shells given and RN will assist with putting on bra.  Manual pump given and instructions given for mom to pre pump.  Hand expression attempted but no colostrum expressed.  Assisted with positioning baby skin to skin in football hold.  Several attempts made to latch but baby unable to grasp tissue.  20 mm nipple shield applied and baby latched well and nursed for 10 minutes.  Drop of colostrum in shield when baby came off.  Report given to [facility] RN.  Patient Name: Vanessa Ritter [id number] Date: 05/11/2015 Reason for consult: Initial assessment;Difficult latch   Maternal Data Formula Feeding for Exclusion: No Has patient been taught Hand Expression?: Yes Does the patient have breastfeeding experience prior to this delivery?: No  Feeding Feeding Type: Breast Fed Length of feed: 10 min  LATCH Score/Interventions Latch: Grasps breast easily, tongue down, lips flanged, rhythmical sucking. (with 20 mm nipple shield) Intervention(s): Skin to skin;Teach feeding cues;Waking techniques Intervention(s): Breast compression;Breast massage;Assist with latch;Adjust position  Audible Swallowing: A few with stimulation Intervention(s): Hand expression;Skin to skin (not seeing colostrum with hand expression) Intervention(s): Hand expression;Alternate breast massage;Skin to skin  Type of Nipple: Inverted Intervention(s): Shells;Hand pump Intervention(s): Hand pump  Comfort (Breast/Nipple): Soft / non-tender     Hold  (Positioning): Assistance needed to correctly position infant at breast and maintain latch. Intervention(s): Breastfeeding basics reviewed;Support Pillows;Position options;Skin to skin  LATCH Score: 6  Lactation Tools Discussed/Used Tools: Nipple Shields Nipple shield size: 20   Consult Status Consult Status: Follow-up Date: 05/12/15 Follow-up type: In-patient    Huston Foley 05/11/2015, 3:17 PM

## 2015-05-11 NOTE — Progress Notes (Signed)
Tracing reviewed: baseline 120 no variability despite maternal O2, Positional changes. VE 8 cm/sl edema/-1  Given the findings; recommend primary C/S for fetal tracing Risk reviewed including infection, bleeding, injury to surrounding  Organ structures, internal scar tissue, poss need for blood  Transfusion. All ? Answered. Or notified. Consent signed

## 2015-05-11 NOTE — Progress Notes (Signed)
S: comfortable  O: epidural VE: forebag. arom clear fluid IUPC placed 3/90/-2  Tracing: baseline 120 (+) accel Ctx q 2-4 mins  IMP: latent phase GBS cx (+) on IV PCN P) pitocin augmentation

## 2015-05-12 ENCOUNTER — Encounter (HOSPITAL_COMMUNITY): Payer: Self-pay | Admitting: Obstetrics and Gynecology

## 2015-05-12 LAB — CBC
HCT: 26.7 % — ABNORMAL LOW (ref 36.0–46.0)
HEMOGLOBIN: 9 g/dL — AB (ref 12.0–15.0)
MCH: 28.5 pg (ref 26.0–34.0)
MCHC: 33.7 g/dL (ref 30.0–36.0)
MCV: 84.5 fL (ref 78.0–100.0)
Platelets: 234 10*3/uL (ref 150–400)
RBC: 3.16 MIL/uL — AB (ref 3.87–5.11)
RDW: 13.8 % (ref 11.5–15.5)
WBC: 18.5 10*3/uL — AB (ref 4.0–10.5)

## 2015-05-12 NOTE — Lactation Note (Signed)
This note was copied from the chart of Vanessa Ritter. Lactation Consultation Note New mom having problem latching. About in tears, RN has been helping mom a lot. Mom calling out frequently for assistance. Baby fussy w/8% weight loss. Mom unable to hand express colostrum. Demonstrated breast massage and hand expression. Had to massage several minutes to obtain colostrum. Collected 2ml of thick colostrum. Mom has flat nipples, Rt. Nipple slightly inverted, pulls out slightly w/ hand pump. Encouraged to hand pump to define the boarders of nipple before applying NS. Reviewed NS application, mom demonstrated several times for LC. NS wouldn't stay on. Wet inside NS, placed cloth under breast to add support to pendulum breast. BF in football hold. Mom has DEBP set up in room. Encouraged to post pump every 3 hours after BF. Discussed cluster feeding. Mom needs a lot of encouragement and reassurance. She really wants to BF, having one inverted nipple and a flat nipple, having to use NS, and pump has over whelmed her. Demonstrated inserting 2ml of colostrum into NS. Baby BF well when left room. Mom extremely tired.  Patient Name: Vanessa Martena Emanuele ZOXWR'U Date: 05/12/2015 Reason for consult: Follow-up assessment;Difficult latch   Maternal Data    Feeding Feeding Type: Breast Milk Length of feed: 15 min (still BF)  LATCH Score/Interventions Latch: Grasps breast easily, tongue down, lips flanged, rhythmical sucking. Intervention(s): Skin to skin;Teach feeding cues;Waking techniques Intervention(s): Adjust position;Assist with latch;Breast massage;Breast compression  Audible Swallowing: A few with stimulation Intervention(s): Skin to skin;Hand expression Intervention(s): Alternate breast massage  Type of Nipple: Flat Intervention(s): Shells;Hand pump;Double electric pump  Comfort (Breast/Nipple): Soft / non-tender     Hold (Positioning): Assistance needed to correctly position infant at  breast and maintain latch. Intervention(s): Breastfeeding basics reviewed;Support Pillows;Position options;Skin to skin  LATCH Score: 7  Lactation Tools Discussed/Used Tools: Shells;Nipple Dorris Carnes;Pump Nipple shield size: 20 Shell Type: Inverted Breast pump type: Double-Electric Breast Pump Pump Review: Setup, frequency, and cleaning;Milk Storage Initiated by:: RN Date initiated:: 05/11/15   Consult Status Consult Status: Follow-up Date: 05/12/15 (in pm) Follow-up type: In-patient    Romar Woodrick, Diamond Nickel 05/12/2015, 2:20 AM

## 2015-05-12 NOTE — Progress Notes (Signed)
POD # 1  Subjective: Pt reports feeling well/ Pain controlled with Motrin Tolerating po/ Foley d/c'd and voiding without problems/ No n/v/ Flatus present Activity: ad lib Bleeding is light Newborn info:  Information for the patient's newborn:  Otha, Rickles Girl Sherol [[id number]]  female   Feeding: breast  Objective:  VS:  Filed Vitals:   05/11/15 1633 05/11/15 2025 05/12/15 0028 05/12/15 0514  BP: 114/61 110/57 109/59 105/55  Pulse: 65 72 63 80  Temp: 98.8 F (37.1 C) 99 F (37.2 C) 99.1 F (37.3 C) 98.5 F (36.9 C)  TempSrc: Oral Oral Oral Oral  Resp: Height:      Weight:      SpO2: 99%  99%      I&O: Intake/Output      01/25 0701 - 01/26 0700 01/26 0701 - 01/27 0700   P.O. 1680    I.V. (mL/kg)     Total Intake(mL/kg) 1680 (21.2)    Urine (mL/kg/hr) 4475 (2.3)    Emesis/NG output 2 (0)    Blood     Total Output 4477     Net -2797             Recent Labs  05/10/15 1854 05/12/15 0500  WBC 11.5* 18.5*  HGB 11.1* 9.0*  HCT 33.2* 26.7*  PLT 285 234    Blood type: --/--/O POS, O POS (01/24 1854) Rubella: Immune (06/21 0000)    Physical Exam:  General: alert, cooperative and no distress CV: Regular rate and rhythm Resp: CTA bilaterally Abdomen: soft, nontender, normal bowel sounds Incision: Covered with Tegaderm and honeycomb dressing; no significant drainage, edema, bruising, or erythema; well approximated with suture and steri strips Uterine Fundus: firm, below umbilicus, nontender Lochia: minimal Ext: extremities normal, atraumatic, no cyanosis or edema and Homans sign is negative, no sign of DVT   Assessment: POD # 1/ G2P1011/ S/P C/Section d/t NRFHR  ABL anemia Doing well  Plan: Ambulate Continue routine post op orders   Signed: Donette Larry, Dorris Carnes, MSN, CNM 05/12/2015, 10:22 AM

## 2015-05-13 NOTE — Progress Notes (Signed)
Patient ID: Vanessa Ritter, female   DOB: [birth year], 30 y.o.   MRN: [id number] Subjective: POD# 2 Information for the patient's newborn:  Vanessa Ritter, Vanessa Ritter Girl Vanessa Ritter [[id number]]  female   Reports feeling well. Feeding: breast Patient reports tolerating PO.  Breast symptoms: will need more assistance from [facility] d/t baby's wt loss  Pain controlled with ibuprofen (OTC) and narcotic analgesics including Percocet Denies HA/SOB/C/P/N/V/dizziness. Flatus present. (+) BM. She reports vaginal bleeding as normal, without clots.  She is ambulating, urinating without difficulty.  Objective:   VS:  Filed Vitals:   05/12/15 0028 05/12/15 0514 05/12/15 1815 05/13/15 0530  BP: 109/59 105/55 97/71 129/68  Pulse: 63 80 75 67  Temp: 99.1 F (37.3 C) 98.5 F (36.9 C) 98.2 F (36.8 C) 97.7 F (36.5 C)  TempSrc: Oral Oral Oral   Resp: Height:      Weight:      SpO2: 99%  100%        Recent Labs  05/10/15 1854 05/12/15 0500  WBC 11.5* 18.5*  HGB 11.1* 9.0*  HCT 33.2* 26.7*  PLT 285 234     Blood type: O POS (01/24 1854)  Rubella: Immune (06/21 0000)     Physical Exam:   General: alert, cooperative, fatigued and no distress  CV: Regular rate and rhythm, S1S2 present or without murmur or extra heart sounds  Resp: clear  Abdomen: soft, nontender, normal bowel sounds  Incision: clean, dry, intact and skin well-approximated with sutures  Uterine Fundus: firm, 2 FB below umbilicus, nontender  Lochia: minimal  Ext: extremities normal, atraumatic, no cyanosis or edema, Homans sign is negative, no sign of DVT and no edema, redness or tenderness in the calves or thighs   Assessment/Plan: 30 y.o.   POD# 2.  S/P Cesarean Delivery.  Indications: non-reassuring FHR tracing and arrest of dilation                Principal Problem:   Postpartum care following cesarean delivery (1/25) Active Problems:   Active labor at term  Doing well, stable.               Regular diet as  tolerated Ambulate Routine post-op care Anticipate D/C home tomorrow  Raelyn Mora, M, MSN, CNM 05/13/2015, 9:26 AM

## 2015-05-13 NOTE — Lactation Note (Signed)
This note was copied from the chart of Vanessa Ritter. Lactation Consultation Note  Patient Name: Vanessa Ritter NFAOZ'H Date: 05/13/2015 Reason for consult: Follow-up assessment  With this mom of a term baby, now 69 hours old. Mom has inverted nipples, but after pumping they everted and mom was able to latch baby, with strong suckles for 5 minutes, then baby unlatched. Mom's nipples painful with latch. Mom pumped with 21 flanges, and was able to express 11 ml's of transitional milk. Mom very happy. She bottle fed this to her baby, and then also fed an additional 16 ml's of Alimentum formula. Mom plans to pump and bottle feed every 3 hours, first EBm and then formula. She may or may not latch each time. Mom has an o/p lactation appointment for 2/3 17.  I also noted the baby has an anterior thin, short tongue frenulum, that could also be the cause of painful latch. Mom aware of this already. Given resources to look up information .    Maternal Data    Feeding Feeding Type: Bottle Fed - Breast Milk Nipple Type: Slow - flow Length of feed: 5 min  LATCH Score/Interventions Latch: Repeated attempts needed to sustain latch, nipple held in mouth throughout feeding, stimulation needed to elicit sucking reflex. Intervention(s): Skin to skin;Teach feeding cues;Waking techniques Intervention(s): Adjust position;Assist with latch;Breast compression  Audible Swallowing: None  Type of Nipple: Inverted (everted after DEP)  Comfort (Breast/Nipple): Filling, red/small blisters or bruises, mild/mod discomfort  Problem noted: Filling;Cracked, bleeding, blisters, bruises;Severe discomfort Interventions  (Cracked/bleeding/bruising/blister): Expressed breast milk to nipple;Double electric pump;Other (comment) (comfort gels) Interventions (Severe discomfort): Double electric pum;Observe pumping;Flange size (decreased mom to 21 with a good fit - mom aware she may have to increas if too tight)  Hold  (Positioning): Assistance needed to correctly position infant at breast and maintain latch. Intervention(s): Breastfeeding basics reviewed;Support Pillows;Position options;Skin to skin  LATCH Score: 3  Lactation Tools Discussed/Used Tools: Comfort gels;Nipple Shields Nipple shield size: 20 Breast pump type: Double-Electric Breast Pump Pump Review: Setup, frequency, and cleaning;Milk Storage   Consult Status Consult Status: Follow-up Date: 05/20/15 Follow-up type: Out-patient    Alfred Levins 05/13/2015, 7:11 PM

## 2015-05-14 ENCOUNTER — Inpatient Hospital Stay (HOSPITAL_COMMUNITY): Admission: RE | Admit: 2015-05-14 | Payer: 59 | Source: Ambulatory Visit

## 2015-05-14 MED ORDER — MAGNESIUM OXIDE -MG SUPPLEMENT 400 (240 MG) MG PO TABS: 400.0000 mg | ORAL_TABLET | Freq: Every day | ORAL | Status: AC

## 2015-05-14 MED ORDER — IBUPROFEN 600 MG PO TABS: 600.0000 mg | ORAL_TABLET | Freq: Four times a day (QID) | ORAL | Status: AC

## 2015-05-14 MED ORDER — FERROUS SULFATE 325 (65 FE) MG PO TABS: 325.0000 mg | ORAL_TABLET | Freq: Every day | ORAL | Status: AC

## 2015-05-14 MED ORDER — OXYCODONE-ACETAMINOPHEN 5-325 MG PO TABS: 1.0000 | ORAL_TABLET | Freq: Four times a day (QID) | ORAL | Status: AC | PRN

## 2015-05-14 NOTE — Discharge Summary (Signed)
POSTOPERATIVE DISCHARGE SUMMARY:  Patient ID: Vanessa Ritter MRN: [id number] DOB/AGE: 11-Nov-1985 30 y.o.  Admit date: 05/10/2015 Admission Diagnoses: 40 weeks / onset of labor  Discharge date:  05/13/2014 Discharge Diagnoses: POD 3 s/p cesarean section for arrest of active labor and NRFHR / mild ABL anemia  Prenatal history: G2P1011   EDC : 05/09/2015, by Other Basis  Prenatal care at [facility]  Primary provider : Cousins Prenatal course uncomplicated  Prenatal Labs: ABO, Rh: --/--/O POS, O POS (01/24 1854)  Antibody: NEG (01/24 1854) Rubella: Immune (06/21 0000)   RPR: Non Reactive (01/24 1854)  HBsAg: Negative (06/21 0000)  HIV: Non-reactive (06/21 0000)  GBS: Positive (12/30 0000)   Medical / Surgical History :  Past medical history:  Past Medical History  Diagnosis Date  . Medical history non-contributory   . Hx of varicella   . Postpartum care following cesarean delivery (1/25) 05/11/2015    Past surgical history:  Past Surgical History  Procedure Laterality Date  . No past surgeries    . Cesarean section N/A 05/11/2015    Procedure: CESAREAN SECTION;  Surgeon: Maxie Better, MD;  Location: [facility];  Service: Obstetrics;  Laterality: N/A;    Family History:  Family History  Problem Relation Age of Onset  . Diabetes Maternal Grandfather     Social History:  reports that she has never smoked. She does not have any smokeless tobacco history on file. She reports that she does not drink alcohol or use illicit drugs.  Allergies: Review of patient's allergies indicates no known allergies.   Current Medications at time of admission:  Prior to Admission medications   Medication Sig Start Date End Date Taking? Authorizing Provider  Prenatal Vit-Fe Fumarate-FA (PRENATAL MULTIVITAMIN) TABS tablet Take 1 tablet by mouth at bedtime.   Yes Historical Provider, MD  ferrous sulfate 325 (65 FE) MG tablet Take 1 tablet (325 mg total) by mouth daily with  breakfast. 05/14/15   Marlinda Mike, CNM  ibuprofen (ADVIL,MOTRIN) 600 MG tablet Take 1 tablet (600 mg total) by mouth every 6 (six) hours. 05/14/15   Marlinda Mike, CNM  Magnesium Oxide 400 (240 Mg) MG TABS Take 400 mg by mouth daily. Take with iron to prevent constipation - may take longer than iron if any constipation 05/14/15   Marlinda Mike, CNM  oxyCODONE-acetaminophen (PERCOCET/ROXICET) 5-325 MG tablet Take 1-2 tablets by mouth every 6 (six) hours as needed for moderate pain or severe pain (every 4-6 hours). 05/14/15   Marlinda Mike, CNM    Intrapartum Course:  Admit for onset labor with labor progression to 8cm dilation with normal labor curve Pain management: epidural Complicated by: arrest of active labor / NRFHR Interventions required: cesarean delivery  Procedures: Cesarean section delivery on 05/10/2014 with delivery of female newborn by Dr Cherly Hensen   See operative report for further details APGAR (1 MIN): 8   APGAR (5 MINS): 9    Postoperative / postpartum course:  Uncomplicated with discharge on POD 3  Discharge Instructions:  Discharged Condition: stable  Activity: pelvic rest and postoperative restrictions x 2   Diet: routine  Medications:    Medication List    TAKE these medications        ferrous sulfate 325 (65 FE) MG tablet  Take 1 tablet (325 mg total) by mouth daily with breakfast.     ibuprofen 600 MG tablet  Commonly known as:  ADVIL,MOTRIN  Take 1 tablet (600 mg total) by mouth every 6 (six) hours.  Magnesium Oxide 400 (240 Mg) MG Tabs  Take 400 mg by mouth daily. Take with iron to prevent constipation - may take longer than iron if any constipation     oxyCODONE-acetaminophen 5-325 MG tablet  Commonly known as:  PERCOCET/ROXICET  Take 1-2 tablets by mouth every 6 (six) hours as needed for moderate pain or severe pain (every 4-6 hours).     prenatal multivitamin Tabs tablet  Take 1 tablet by mouth at bedtime.        Wound Care: keep clean and  dry / remove honeycomb POD 5 Postpartum Instructions: [facility] discharge booklet - instructions reviewed  Discharge to: Home  Follow up :   [facility] in 6 weeks for routine postpartum visit with Dr Cherly Hensen                Signed: Marlinda Mike CNM, MSN, [facility] 05/14/2015, 10:06 AM

## 2015-05-14 NOTE — Progress Notes (Signed)
POSTOPERATIVE DAY # 3 S/P cesarean section  S:         Reports feeling well              Tolerating po intake / no nausea / no vomiting / + flatus / no BM             Bleeding is light             Pain controlled with motrin and percocet             Up ad lib / ambulatory/ voiding QS  Newborn breast feeding - awaiting to see if Peds will DC baby today   O:  VS: BP 127/68 mmHg  Pulse 61  Temp(Src) 98.3 F (36.8 C) (Oral)  Resp 18  Ht  (1.651 m)  Wt 79.379 kg (175 lb)  BMI 29.12 kg/m2  SpO2 100%  LMP 07/26/2014  Breastfeeding? Unknown   LABS:               Recent Labs  05/12/15 0500  WBC 18.5*  HGB 9.0*  PLT 234               Bloodtype: --/--/O POS, O POS (01/24 1854)  Rubella: Immune (06/21 0000)                                             Physical Exam:             Alert and Oriented X3  Lungs: Clear and unlabored  Heart: regular rate and rhythm / no mumurs  Abdomen: soft, non-tender, non-distended, active BS             Fundus: firm, non-tender, U-1             Dressing intact honeycomb              Incision:  approximated with suture / no erythema / no ecchymosis / no drainage  Perineum: intact  Lochia: light  Extremities: trace edema, no calf pain or tenderness, negative Homans  A:        POD # 3 S/P cesarean section            Mild ABL anemia  P:        Routine postoperative care              DC home - ok to room-in if newborn remains additional day              WOB booklet - instructions reviewed   Marlinda Mike CNM, MSN, [facility] 05/14/2015, 9:15 AM

## 2015-05-14 NOTE — Lactation Note (Signed)
This note was copied from the chart of Vanessa Robbin Christoph. Lactation Consultation Note  Mother's breasts are filling and is doing much better w/ breastfeeding.  Weight has increased. Mother applied #20NS and latched baby in football.  Sucks and swallows observed. Provided mother with #24NS for L side and use #20NS on R side. Mother recently pumped 26ml.  Encouraged mother to continue pumping 4-6x day for 10-15 min. Give baby back volume pumped after feeding.  Discussed that it is acceptable to give baby bm supplement w/ slow flow nipple after feeding. Try at least once a day to bf without NS. Alternate with comfort gels and shells.  Discussed applying ebm and coconut oil for soreness. Provided mother w/ written feeding plan. Reviewed engorgement care and monitoring voids/stools. Mom encouraged to feed baby 8-12 times/24 hours and with feeding cues.  Outpatient OP 2/3.     Patient Name: Vanessa Ritter NFAOZ'H Date: 05/14/2015     Maternal Data    Feeding    LATCH Score/Interventions                      Lactation Tools Discussed/Used     Consult Status      Dahlia Byes [facility] 05/14/2015, 9:37 AM

## 2015-05-20 ENCOUNTER — Ambulatory Visit (HOSPITAL_COMMUNITY): Admit: 2015-05-20 | Payer: 59

## 2016-05-23 IMAGING — US US OB TRANSVAGINAL
1 series · 14 of 28 positions shown · non-contrast
Comparison: None.

CLINICAL DATA: Pelvic pain with positive pregnancy test

EXAM:
OBSTETRIC <14 WK US AND TRANSVAGINAL OB US
TECHNIQUE: Both transabdominal and transvaginal ultrasound examinations were
performed for complete evaluation of the gestation as well as the
maternal uterus, adnexal regions, and pelvic cul-de-sac.
Transvaginal technique was performed to assess early pregnancy.

[Series 1: us ob comp less 14 wks · 14 of 35 slices shown]
[im 2/35]
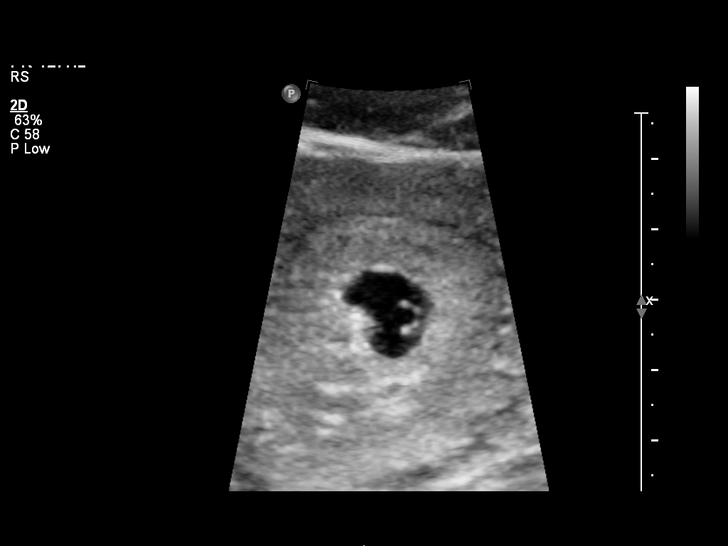
[im 4/35]
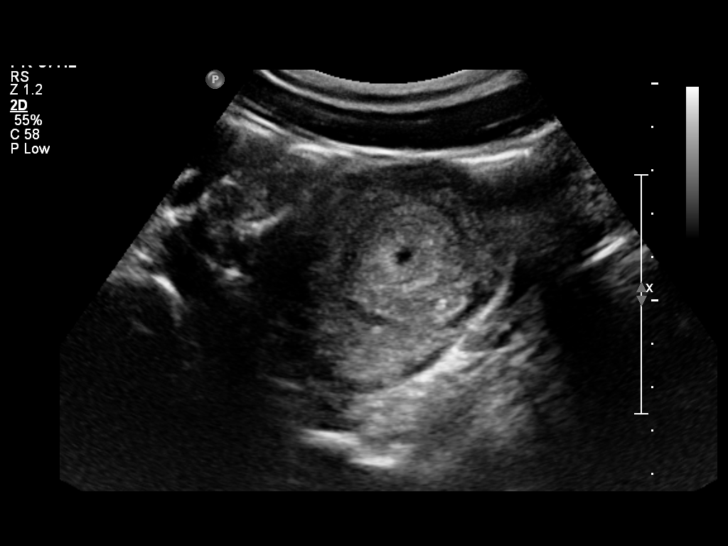
[im 7/35]
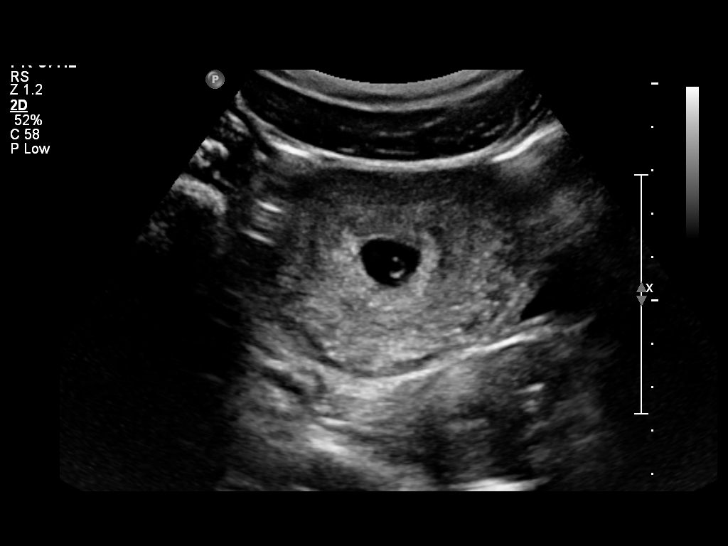
[im 9/35]
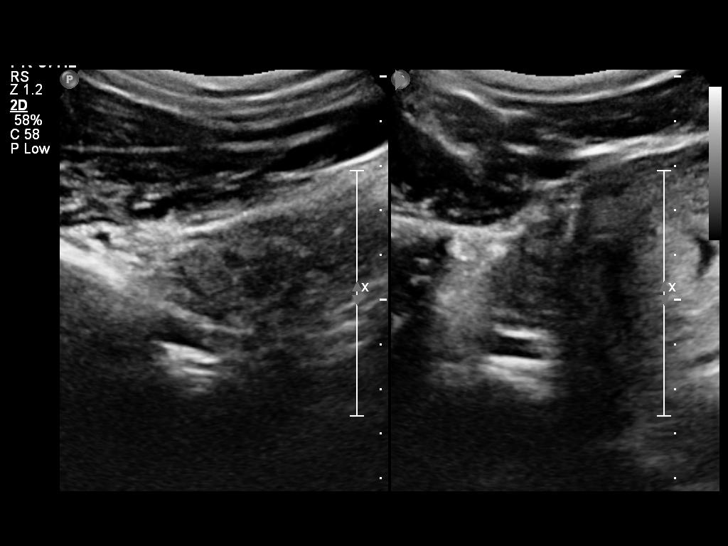
[im 12/35]
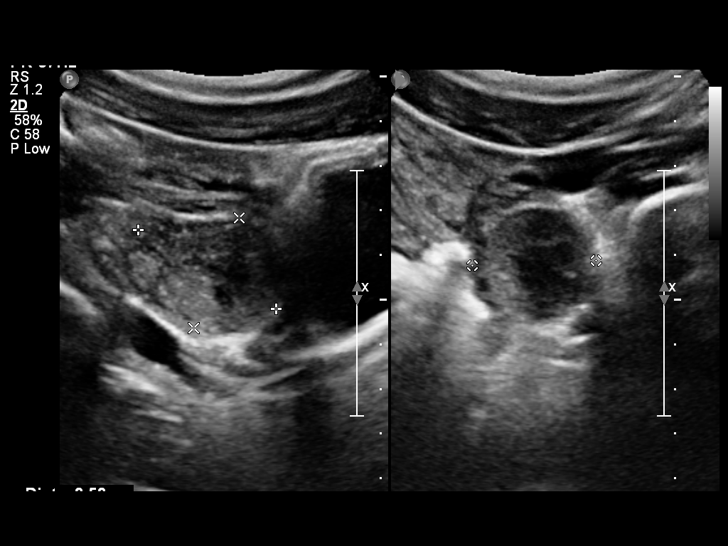
[im 14/35]
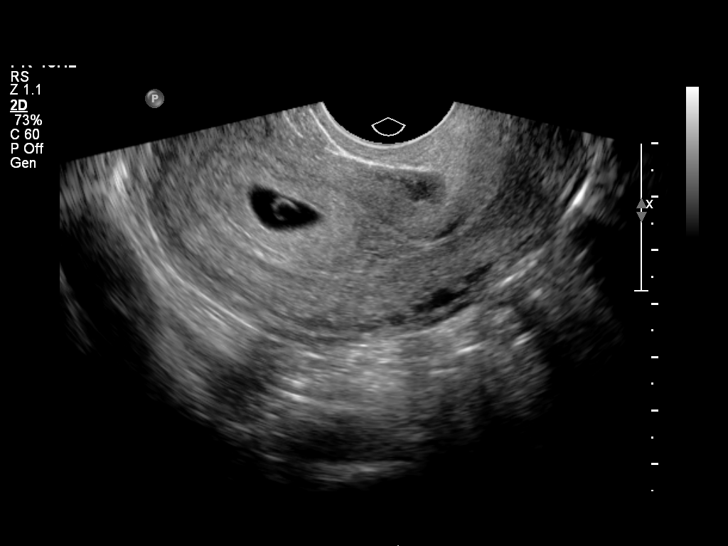
[im 17/35]
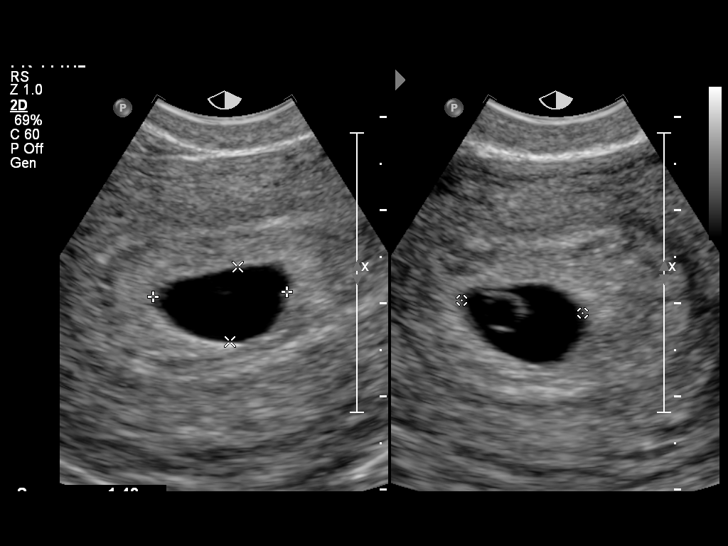
[im 19/35]
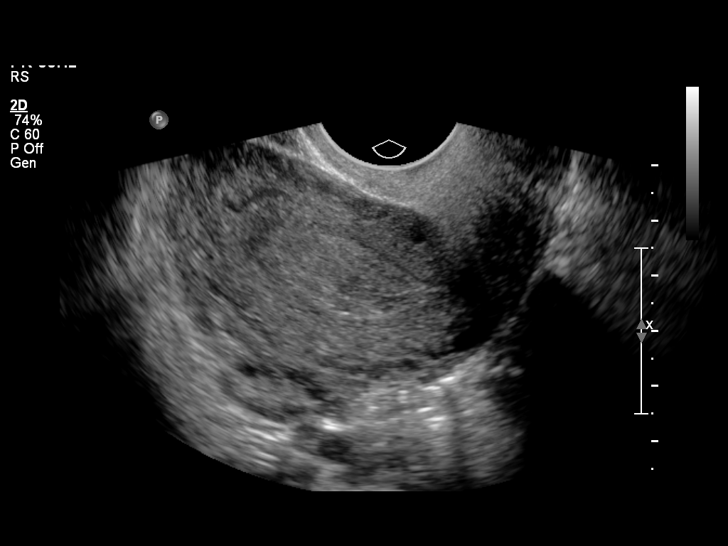
[im 22/35]
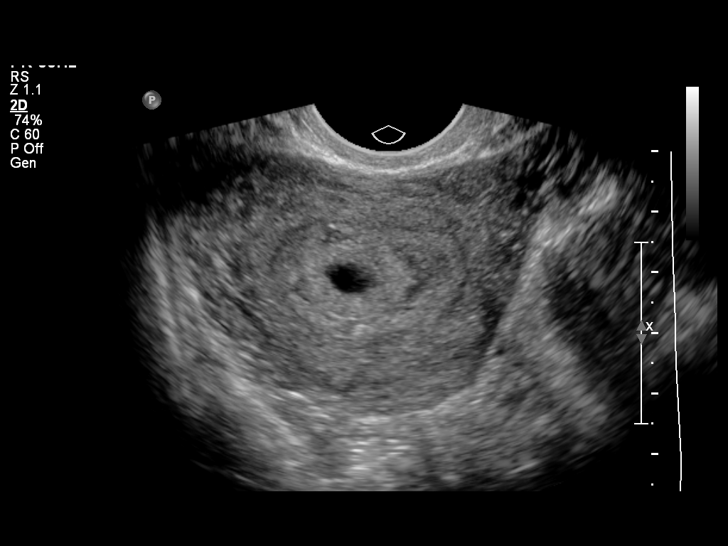
[im 24/35]
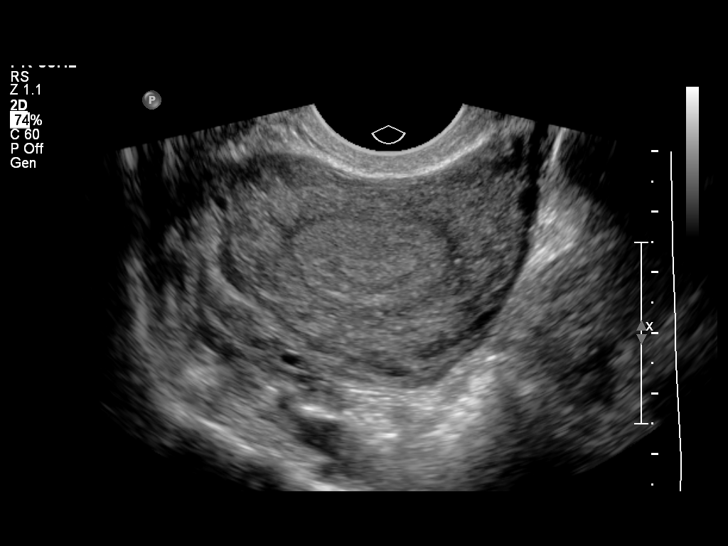
[im 27/35]
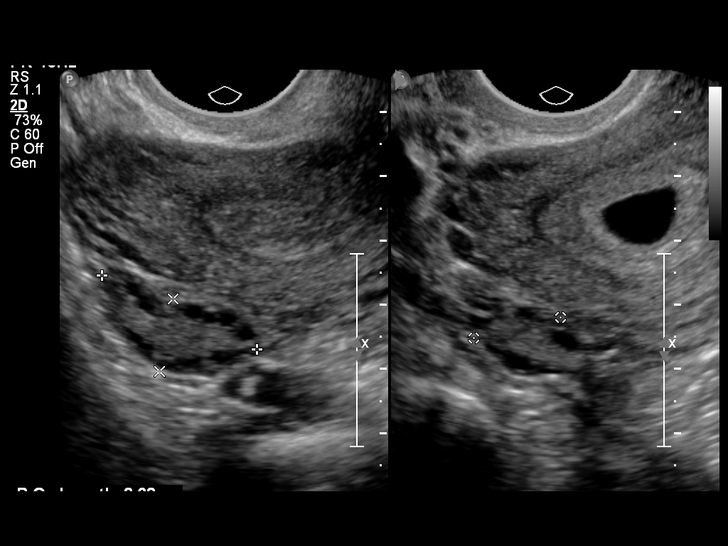
[im 29/35]
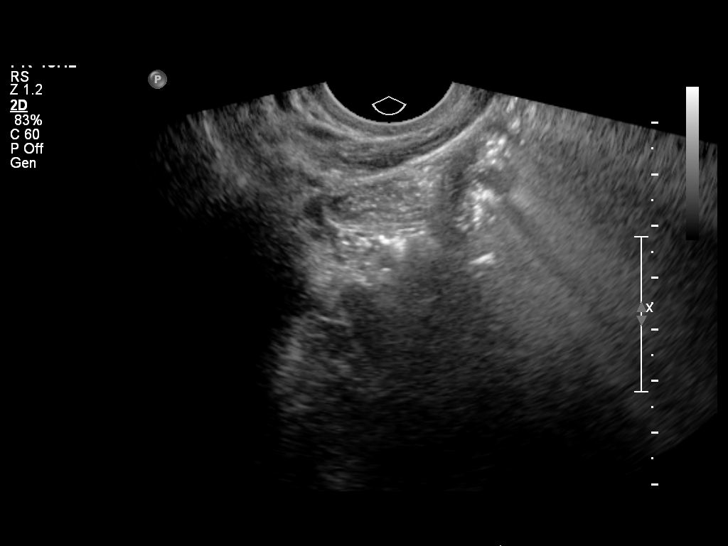
[im 32/35]
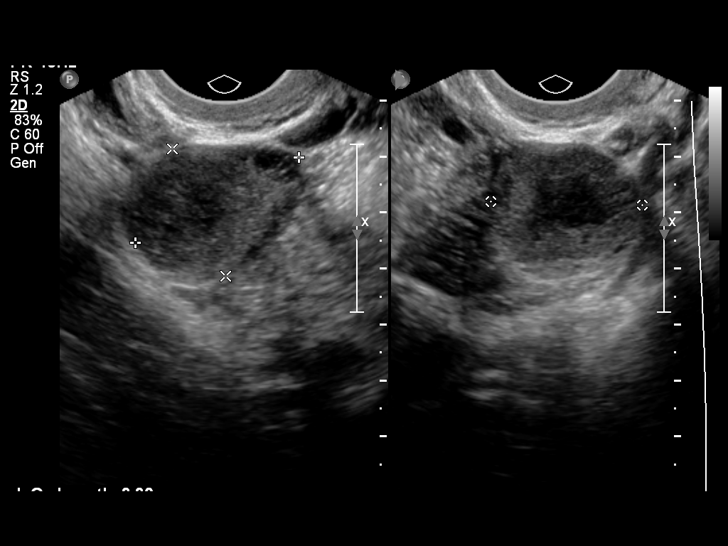
[im 35/35]
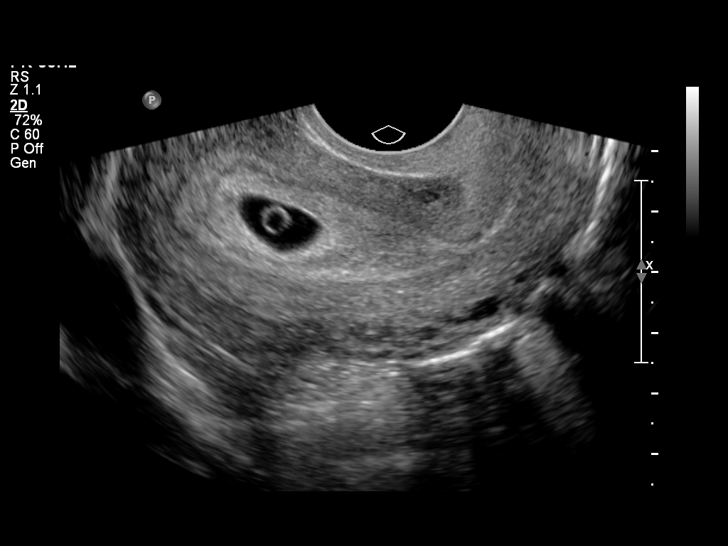

[14 of 28 positions shown; findings below may reference images not displayed]

FINDINGS: Intrauterine gestational sac: Visualized/normal in shape.

Yolk sac:  Present

Embryo:  Not visualized

MSD:  11.8  mm   5 w   6  d

US EDC: 10/11/2014

Maternal uterus/adnexae: No subchorionic hemorrhage is noted. Corpus
luteum cyst is noted within the left ovary. No other focal
abnormality is noted.
IMPRESSION: Probable early intrauterine gestational sac, but no fetal pole, or
cardiac activity yet visualized. Recommend follow-up quantitative
B-HCG levels and follow-up US in 14 days to confirm and assess
viability. This recommendation follows SRU consensus guidelines:
Diagnostic Criteria for Nonviable Pregnancy Early in the First
Trimester. N Engl J Med 9757; [DATE].
